# Patient Record
Sex: Male | Born: 1985 | Race: White | Hispanic: No | Marital: Single | Smoking: Current every day smoker
Health system: Southern US, Community
[De-identification: ages and names within clinical notes are randomized; demographics above are authoritative.]

## PROBLEM LIST (undated history)

## (undated) HISTORY — PX: TONSILLECTOMY: SUR1361

---

## 2017-05-24 ENCOUNTER — Ambulatory Visit
Admission: EM | Admit: 2017-05-24 | Discharge: 2017-05-24 | Disposition: A | Discharge status: Home / Self Care | Payer: PRIVATE HEALTH INSURANCE | Attending: Family Medicine | Admitting: Family Medicine

## 2017-05-24 ENCOUNTER — Ambulatory Visit (INDEPENDENT_AMBULATORY_CARE_PROVIDER_SITE_OTHER): Payer: PRIVATE HEALTH INSURANCE

## 2017-05-24 ENCOUNTER — Encounter: Payer: Self-pay | Admitting: *Deleted

## 2017-05-24 DIAGNOSIS — R059 Cough, unspecified: Secondary | ICD-10-CM

## 2017-05-24 DIAGNOSIS — R05 Cough: Secondary | ICD-10-CM | POA: Diagnosis not present

## 2017-05-24 DIAGNOSIS — J069 Acute upper respiratory infection, unspecified: Secondary | ICD-10-CM

## 2017-05-24 MED ORDER — ALBUTEROL SULFATE HFA 108 (90 BASE) MCG/ACT IN AERS
2.0000 | INHALATION_SPRAY | RESPIRATORY_TRACT | 0 refills | Status: AC | PRN
Start: 2017-05-24 — End: ?

## 2017-05-24 MED ORDER — BENZONATATE 100 MG PO CAPS: 100.0000 mg | ORAL_CAPSULE | Freq: Three times a day (TID) | ORAL | 0 refills | Status: AC | PRN

## 2017-05-24 MED ORDER — AZITHROMYCIN 250 MG PO TABS: ORAL_TABLET | ORAL | 0 refills | Status: AC

## 2017-05-24 MED ORDER — PREDNISONE 20 MG PO TABS: 40.0000 mg | ORAL_TABLET | Freq: Every day | ORAL | 0 refills | Status: AC

## 2017-05-24 NOTE — ED Triage Notes (Signed)
Patient reports symptoms of fever, lower back ache, and night sweats starting 5 days ago. No sore throat reported.

## 2017-05-24 NOTE — Discharge Instructions (Signed)
Take medication as prescribed. Rest. Drink plenty of fluids.  ° °Follow up with your primary care physician this week as needed. Return to Urgent care for new or worsening concerns.  ° °

## 2017-05-24 NOTE — ED Provider Notes (Signed)
[facility] ____________________________________________  Time seen: Approximately 12:02 PM  I have reviewed the triage vital signs and the nursing notes.   HISTORY  Chief Complaint Cough and Fever   HPI Lucas Mendez is a 31 y.o. male presenting for evaluation of 5-6 days of cough and chest congestion. States that has some nasal congestion that has since resolved. States cough is biggest complaint and often wakes him up at night. States cough is occasionally productive of whitish phlegm, often dry. States a few days ago did hear some wheezing, none since. Reports Tuesday night had a fever of 102 orally. States has also felt warm with chills intermittently. States has taken over-the-counter DayQuil and NyQuil with some Tylenol intermittently with some improvement, no resolution. Denies chest pain or shortness of breath. Denies hemoptysis. Denies dysuria or hematuria or blood in stool. , Denies insect contacts. 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 Presents otherwise feels well.  Denies chest pain, shortness of breath, abdominal pain, extremity swelling or rash. Denies recent sickness. Denies recent antibiotic use.      History reviewed. No pertinent past medical history. denies There are no active problems to display for this patient.   Past Surgical History:  Procedure Laterality Date  . TONSILLECTOMY       No current facility-administered medications for this encounter.   Current Outpatient Prescriptions:  .  albuterol (PROVENTIL HFA;VENTOLIN HFA) 108 (90 Base) MCG/ACT inhaler, Inhale 2 puffs into the lungs every 4 (four) hours as needed for wheezing., Disp: 1 Inhaler, Rfl: 0 .  azithromycin (ZITHROMAX Z-PAK) 250 MG tablet, Take 2 tablets (500 mg) on  Day 1,  followed by 1 tablet (250 mg) once daily on Days 2 through 5., Disp: 6 each, Rfl: 0 .  benzonatate (TESSALON PERLES) 100 MG  capsule, Take 1 capsule (100 mg total) by mouth 3 (three) times daily as needed for cough., Disp: 15 capsule, Rfl: 0 .  predniSONE (DELTASONE) 20 MG tablet, Take 2 tablets (40 mg total) by mouth daily., Disp: 10 tablet, Rfl: 0  Allergies Patient has no known allergies.  History reviewed. No pertinent family history.  Social History Social History  Substance Use Topics  . Smoking status: Current Every Day Smoker    Packs/day: 1.00    Types: Cigarettes  . Smokeless tobacco: Never Used  . Alcohol use No   Patient states very rare occasional use of alcohol in the home. Patient reports he used to use oral drugs, no longer in last 5 years.  Review of Systems Constitutional: As above.  Eyes: No visual changes. ENT: No sore throat. Cardiovascular: Denies chest pain. Respiratory: Denies shortness of breath. Gastrointestinal: No abdominal pain.  No nausea, no vomiting.  No diarrhea.  No constipation. Genitourinary: Negative for dysuria. Musculoskeletal: As above.  Skin: Negative for rash.   ____________________________________________   PHYSICAL EXAM:  VITAL SIGNS: ED Triage Vitals  Enc Vitals Group     BP 05/24/17 1116 125/70     Pulse Rate 05/24/17 1116 62     Resp 05/24/17 1116 16     Temp 05/24/17 1116 98.6 F (37 C)     Temp Source 05/24/17 1116 Oral     SpO2 05/24/17 1116 100 %     Weight 05/24/17 1117 130 lb (59 kg)     Height 05/24/17 1117  (1.702 m)     Head Circumference --  Peak Flow --      Pain Score 05/24/17 1117 2     Pain Loc --      Pain Edu? --      Excl. in GC? --     Constitutional: Alert and oriented. Well appearing and in no acute distress. Eyes: Conjunctivae are normal. Head: Atraumatic. No sinus tenderness to palpation. No swelling. No erythema.  Ears: no erythema, normal TMs bilaterally.   Nose:Mild nasal congestion   Mouth/Throat: Mucous membranes are moist. No pharyngeal erythema. No tonsillar swelling or exudate.  Neck: No  stridor.  No cervical spine tenderness to palpation. Hematological/Lymphatic/Immunilogical: No cervical lymphadenopathy. Cardiovascular: Normal rate, regular rhythm. Grossly normal heart sounds.  Good peripheral circulation. Respiratory: Normal respiratory effort.  No retractions.  Good air movement. No wheezes. Scattered rhonchi. Dry intermittent cough noted in room with bronchospasm. Gastrointestinal: Soft and nontender.  Musculoskeletal: Ambulatory with steady gait. No cervical, thoracic or lumbar tenderness to palpation. Mild bilateral lower para thoracic and lumbar tenderness, no midline tenderness, no bony tenderness. Neurologic:  Normal speech and language. No gait instability. Skin:  Skin appears warm, dry and intact. No rash noted. Psychiatric: Mood and affect are normal. Speech and behavior are normal.  ___________________________________________   LABS (all labs ordered are listed, but only abnormal results are displayed)  Labs Reviewed - No data to display ____________________________________________  RADIOLOGY  Dg Chest 2 View  Result Date: 05/24/2017 CLINICAL DATA:  Cough and fever EXAM: CHEST  2 VIEW COMPARISON:  None. FINDINGS: Lungs are clear. Heart size and pulmonary vascularity are normal. No adenopathy. There is upper thoracic levoscoliosis with midthoracic dextroscoliosis. IMPRESSION: No edema or consolidation. Electronically Signed   By: Bretta Bang III M.D.   On: 05/24/2017 12:32   ___________________________________________  PROCEDURES Procedures   INITIAL IMPRESSION / ASSESSMENT AND PLAN / ED COURSE  Pertinent labs & imaging results that were available during my care of the patient were reviewed by me and considered in my medical decision making (see chart for details).  Well-appearing patient. Chest x-ray per radiologist no edema or consolidation. Suspect viral upper extremity infection, concern for secondary infection.  also discussed suspect muscle  strain from coughing. Discussed with patient will supportively with prednisone, tessalon Perles, when necessary albuterol inhaler. Encouraged use of this medication regimen for at least 2 more days and symptoms continue then start oral antibiotic azithromycin. Discussed indication, risks and benefits of medications with patient.  Encourage reevaluation for any worsening concerns.  Discussed follow up with Primary care physician this week. Discussed follow up and return parameters including no resolution or any worsening concerns. Patient verbalized understanding and agreed to plan.   ____________________________________________   FINAL CLINICAL IMPRESSION(S) / ED DIAGNOSES  Final diagnoses:  Upper respiratory tract infection, unspecified type  Cough     Discharge Medication List as of 05/24/2017 12:57 PM    START taking these medications   Details  albuterol (PROVENTIL HFA;VENTOLIN HFA) 108 (90 Base) MCG/ACT inhaler Inhale 2 puffs into the lungs every 4 (four) hours as needed for wheezing., Starting Wed 05/24/2017, Normal    azithromycin (ZITHROMAX Z-PAK) 250 MG tablet Take 2 tablets (500 mg) on  Day 1,  followed by 1 tablet (250 mg) once daily on Days 2 through 5., Normal    benzonatate (TESSALON PERLES) 100 MG capsule Take 1 capsule (100 mg total) by mouth 3 (three) times daily as needed for cough., Starting Wed 05/24/2017, Normal    predniSONE (DELTASONE) 20 MG tablet Take 2  tablets (40 mg total) by mouth daily., Starting Wed 05/24/2017, Normal        Note: This dictation was prepared with Dragon dictation along with smaller phrase technology. Any transcriptional errors that result from this process are unintentional.         Renford DillsMiller, Dajanee Voorheis, NP 05/24/17 1537

## 2017-11-14 ENCOUNTER — Other Ambulatory Visit: Payer: Self-pay | Admitting: Family Medicine

## 2017-11-14 ENCOUNTER — Ambulatory Visit
Admission: RE | Admit: 2017-11-14 | Discharge: 2017-11-14 | Disposition: A | Discharge status: Home / Self Care | Payer: PRIVATE HEALTH INSURANCE | Source: Ambulatory Visit | Attending: Family Medicine | Admitting: Family Medicine

## 2017-11-14 DIAGNOSIS — R61 Generalized hyperhidrosis: Secondary | ICD-10-CM | POA: Diagnosis present

## 2017-11-14 DIAGNOSIS — R634 Abnormal weight loss: Secondary | ICD-10-CM | POA: Insufficient documentation

## 2018-08-22 IMAGING — CR DG CHEST 2V
2 series · 2 of 2 positions shown · non-contrast
Comparison: None.

CLINICAL DATA: Cough and fever

EXAM:
CHEST  2 VIEW

[chest pa]
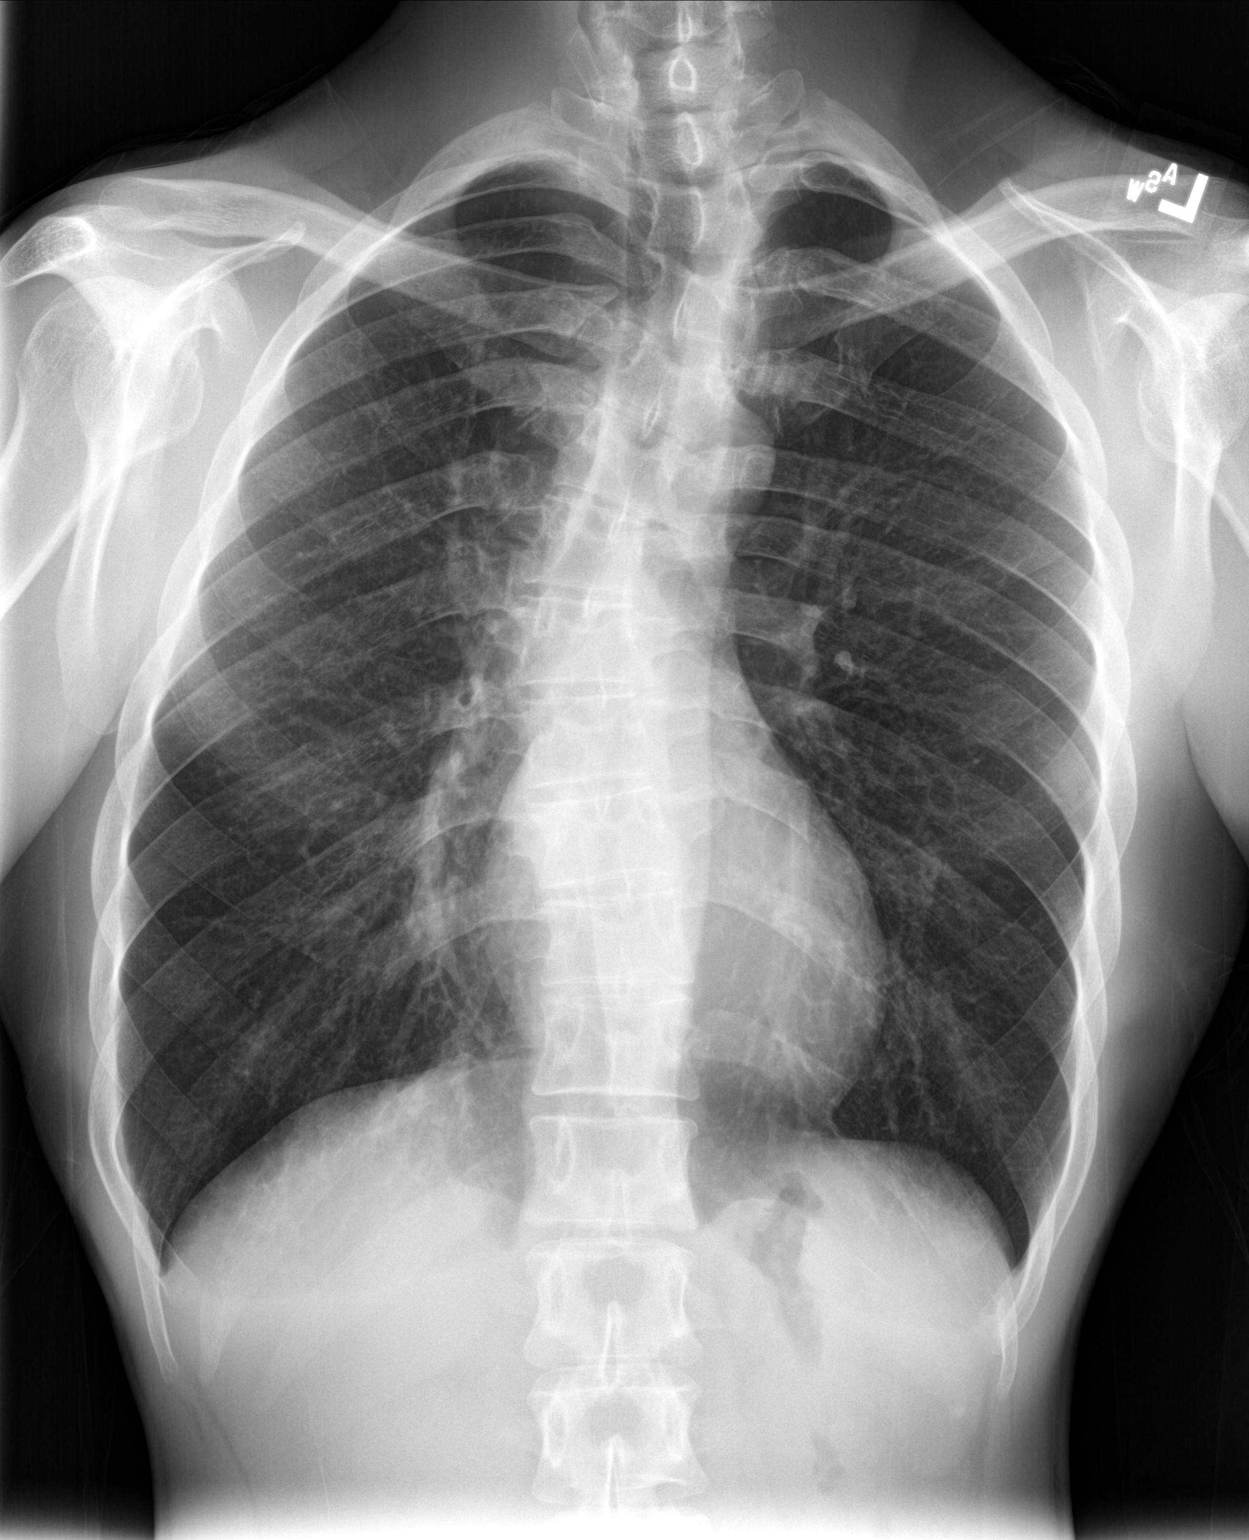

[chest lat]
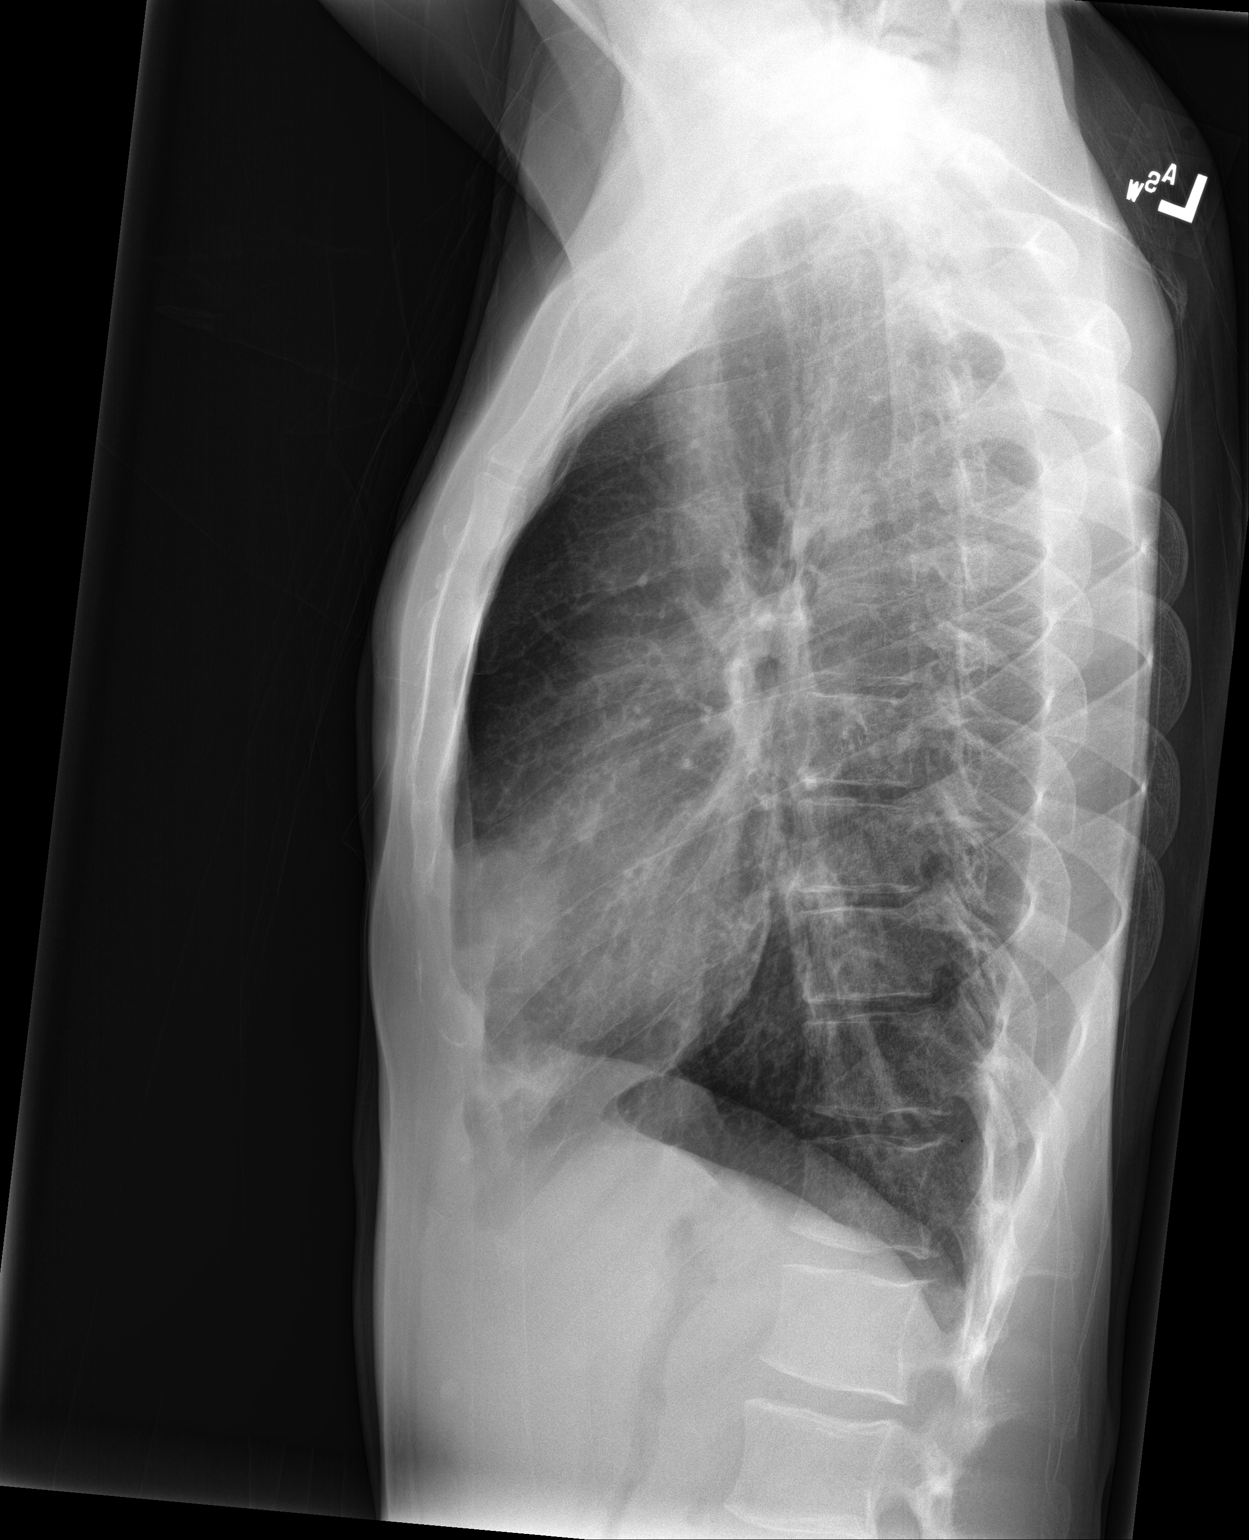

[2 of 2 positions shown; findings below may reference images not displayed]

FINDINGS: Lungs are clear. Heart size and pulmonary vascularity are normal. No
adenopathy. There is upper thoracic levoscoliosis with midthoracic
dextroscoliosis.
IMPRESSION: No edema or consolidation.

## 2019-02-12 IMAGING — CR DG CHEST 2V
3 series · 3 of 3 positions shown · non-contrast
Comparison: Radiographs May 24, 2017.

CLINICAL DATA: Chest pain.

EXAM:
CHEST - 2 VIEW

[chest pa]
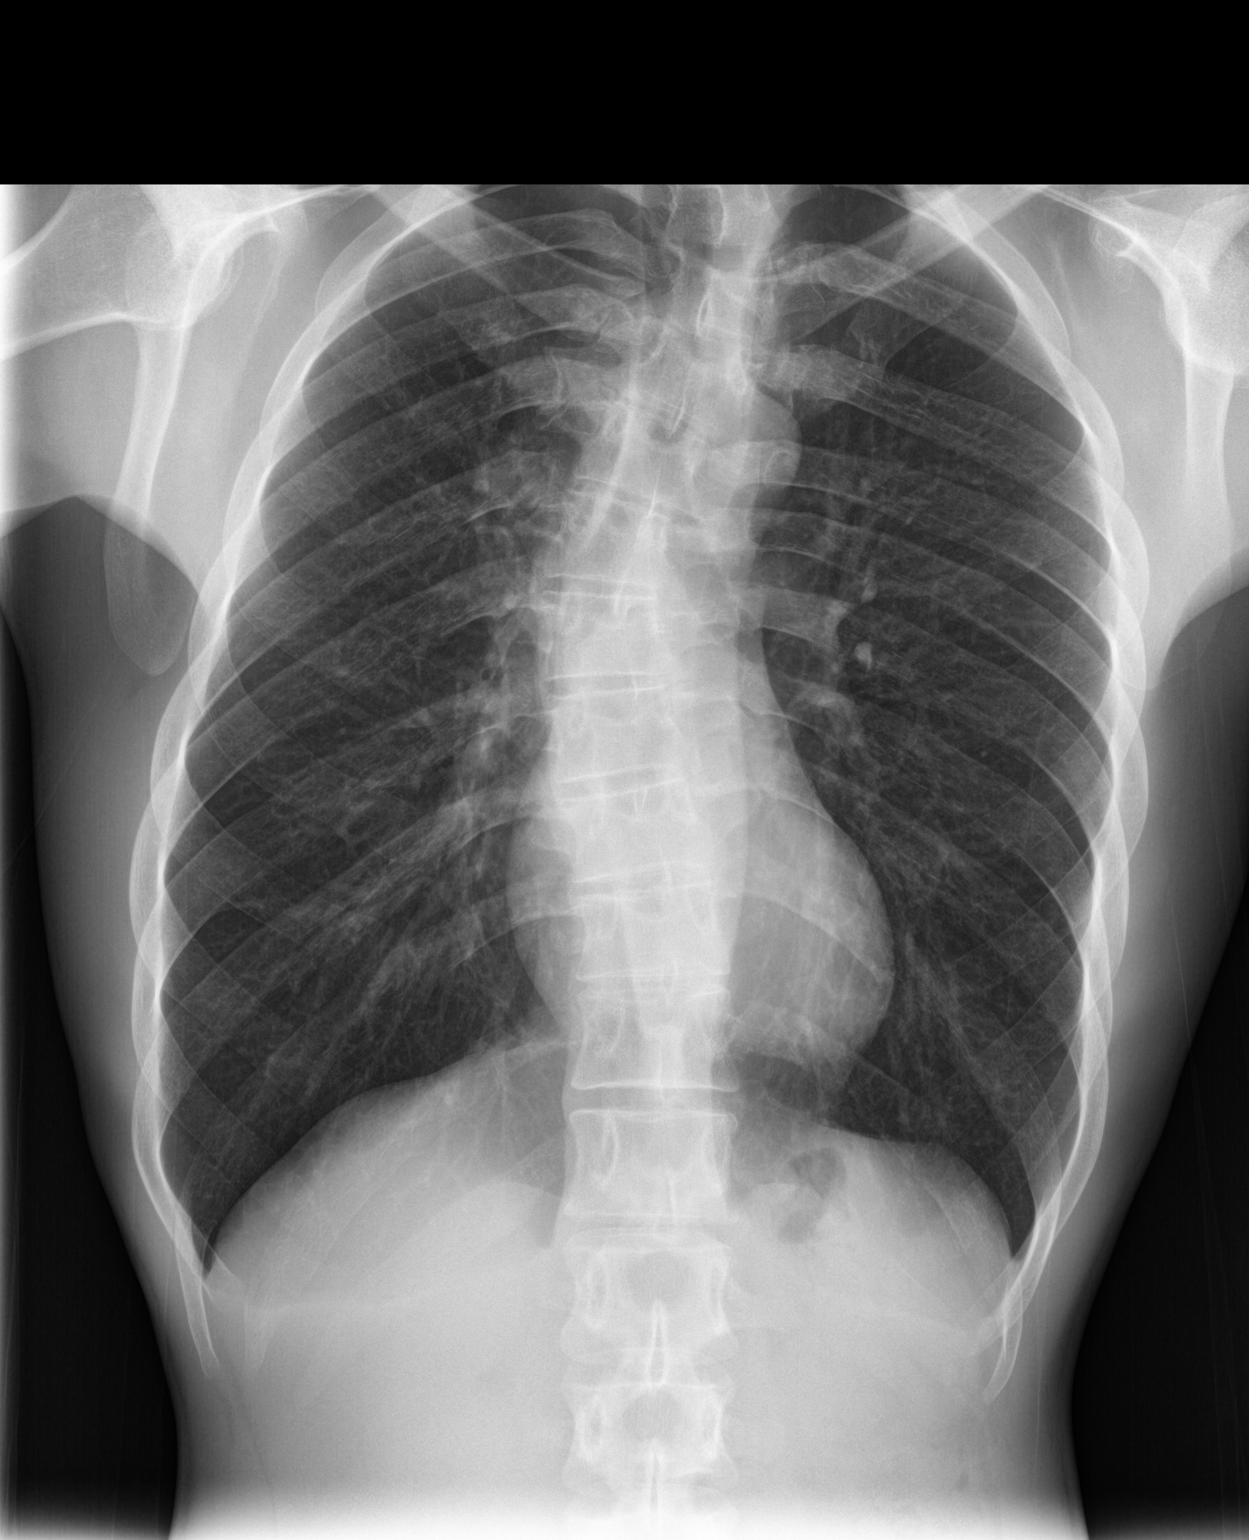

[chest lat (1 of 2)]
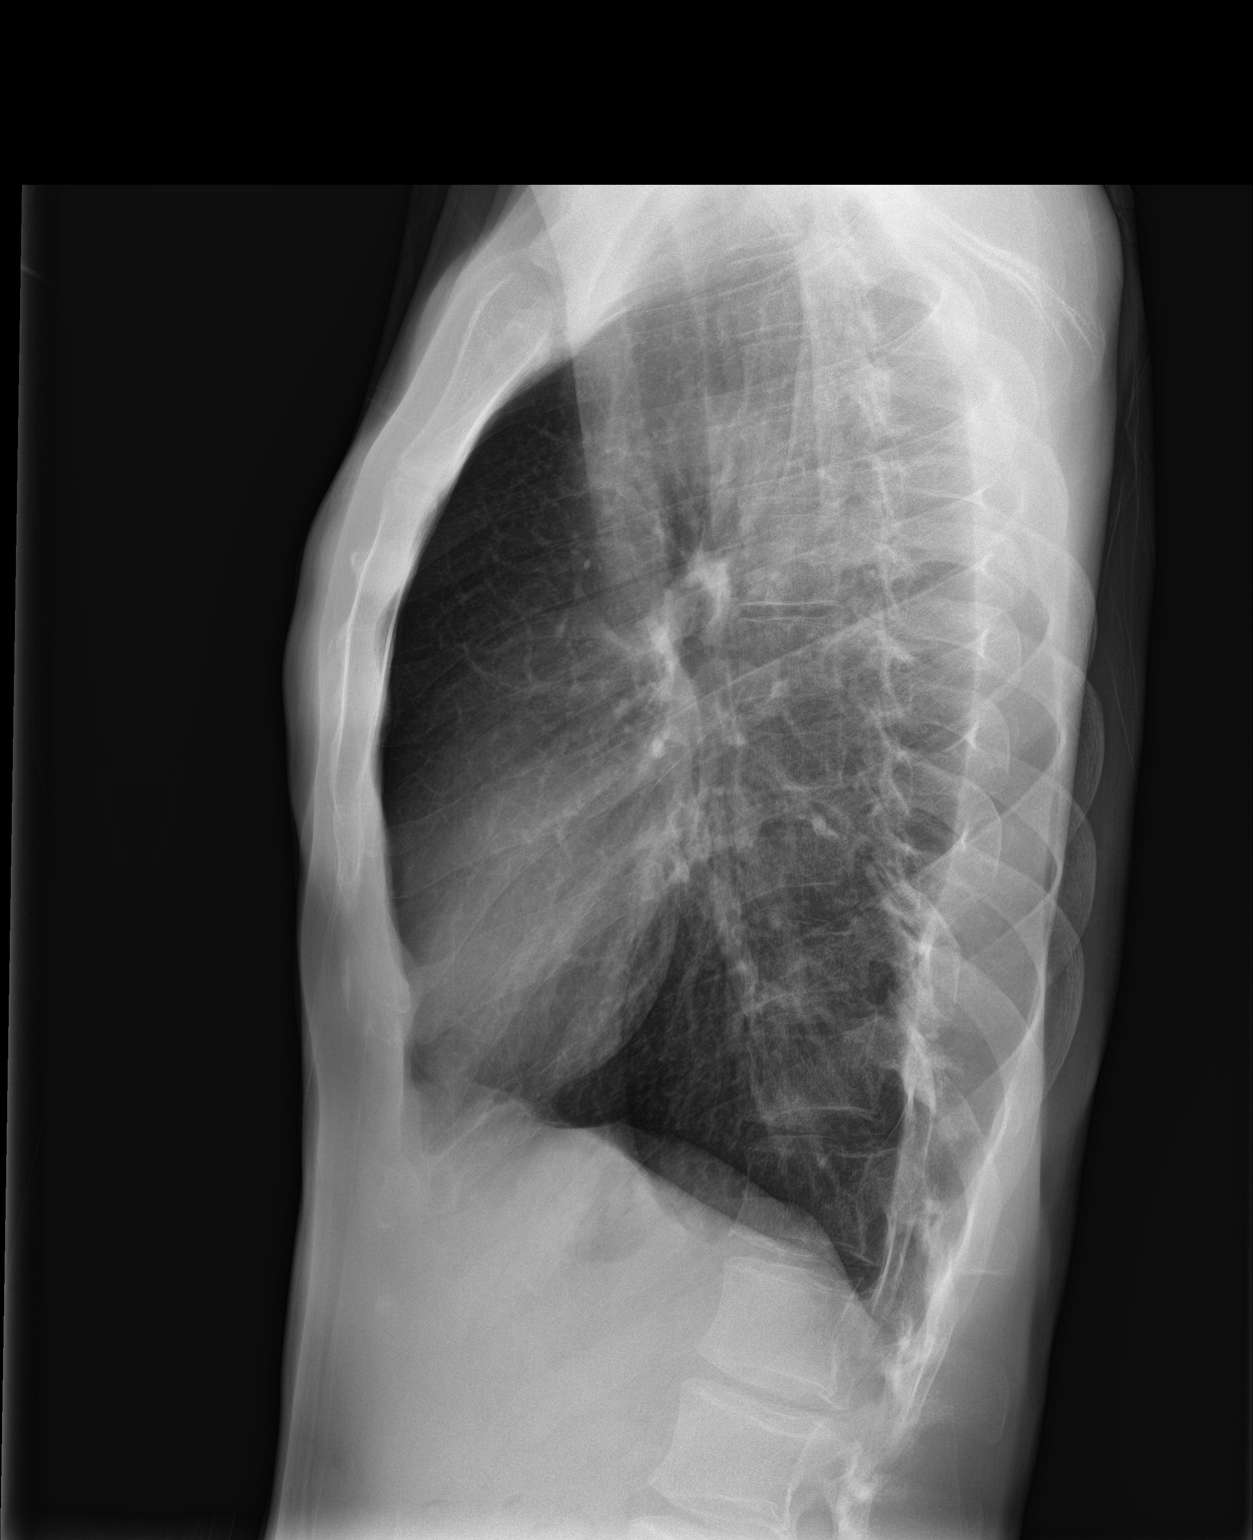

[chest lat (2 of 2)]
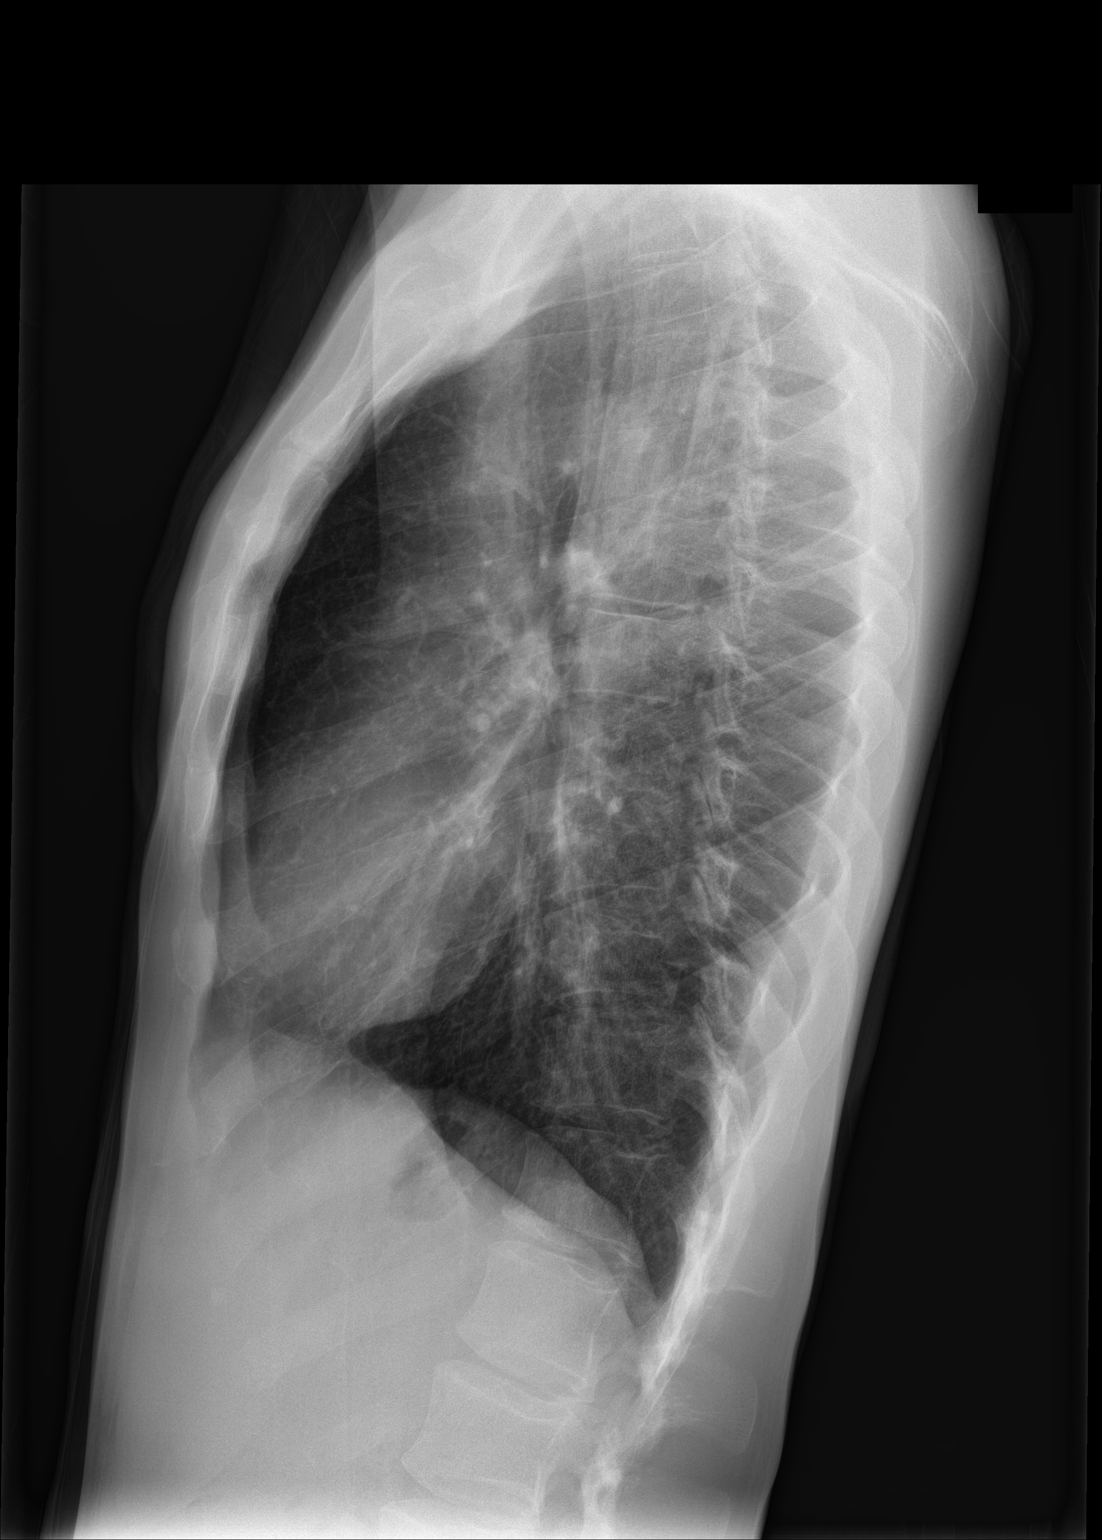

[3 of 3 positions shown; findings below may reference images not displayed]

FINDINGS: The heart size and mediastinal contours are within normal limits.
Both lungs are clear. No pneumothorax or pleural effusion is noted.
Mild dextroscoliosis is noted.
IMPRESSION: No active cardiopulmonary disease.
